# Patient Record
Sex: Male | Born: 1965 | Race: Asian | Hispanic: No | Marital: Single
Health system: Southern US, Community
[De-identification: ages and names within clinical notes are randomized; demographics above are authoritative.]

---

## 2005-09-29 ENCOUNTER — Ambulatory Visit (HOSPITAL_COMMUNITY): Admission: RE | Admit: 2005-09-29 | Discharge: 2005-09-29 | Payer: Self-pay | Admitting: Internal Medicine

## 2009-11-11 ENCOUNTER — Ambulatory Visit: Payer: Self-pay | Admitting: Gastroenterology

## 2009-11-26 ENCOUNTER — Ambulatory Visit (HOSPITAL_COMMUNITY): Admission: RE | Admit: 2009-11-26 | Discharge: 2009-11-26 | Payer: Self-pay | Admitting: Gastroenterology

## 2009-12-16 ENCOUNTER — Ambulatory Visit: Payer: Self-pay | Admitting: Gastroenterology

## 2010-03-24 ENCOUNTER — Ambulatory Visit: Payer: Self-pay | Admitting: Gastroenterology

## 2010-07-07 ENCOUNTER — Ambulatory Visit: Payer: Self-pay | Admitting: Gastroenterology

## 2010-07-28 ENCOUNTER — Ambulatory Visit (HOSPITAL_COMMUNITY): Admission: RE | Admit: 2010-07-28 | Discharge: 2010-07-28 | Payer: Self-pay | Admitting: Gastroenterology

## 2010-10-06 ENCOUNTER — Ambulatory Visit: Payer: Self-pay | Admitting: Gastroenterology

## 2010-11-05 ENCOUNTER — Encounter: Payer: Self-pay | Admitting: Gastroenterology

## 2010-11-06 ENCOUNTER — Encounter: Payer: Self-pay | Admitting: Internal Medicine

## 2011-02-14 ENCOUNTER — Other Ambulatory Visit: Payer: Self-pay | Admitting: Gastroenterology

## 2011-02-14 DIAGNOSIS — B169 Acute hepatitis B without delta-agent and without hepatic coma: Secondary | ICD-10-CM

## 2011-02-17 ENCOUNTER — Ambulatory Visit (HOSPITAL_COMMUNITY)
Admission: RE | Admit: 2011-02-17 | Discharge: 2011-02-17 | Disposition: A | Discharge status: Home / Self Care | Payer: BC Managed Care – PPO | Source: Ambulatory Visit | Attending: Gastroenterology | Admitting: Gastroenterology

## 2011-02-17 DIAGNOSIS — B169 Acute hepatitis B without delta-agent and without hepatic coma: Secondary | ICD-10-CM

## 2011-02-17 DIAGNOSIS — B1910 Unspecified viral hepatitis B without hepatic coma: Secondary | ICD-10-CM | POA: Insufficient documentation

## 2011-03-23 ENCOUNTER — Other Ambulatory Visit: Payer: Self-pay | Admitting: Gastroenterology

## 2011-03-23 ENCOUNTER — Ambulatory Visit (INDEPENDENT_AMBULATORY_CARE_PROVIDER_SITE_OTHER): Payer: BC Managed Care – PPO | Admitting: Gastroenterology

## 2011-03-23 VITALS — BP 107/80 | HR 80 | Temp 97.1°F | Ht 69.0 in | Wt 209.0 lb

## 2011-03-23 DIAGNOSIS — B181 Chronic viral hepatitis B without delta-agent: Secondary | ICD-10-CM

## 2011-03-24 LAB — HEPATIC FUNCTION PANEL
Albumin: 4.6 g/dL (ref 3.5–5.2)
Alkaline Phosphatase: 65 U/L (ref 39–117)
Total Bilirubin: 0.3 mg/dL (ref 0.3–1.2)
Total Protein: 7.2 g/dL (ref 6.0–8.3)

## 2011-03-24 LAB — PHOSPHORUS: Phosphorus: 2.8 mg/dL (ref 2.3–4.6)

## 2011-03-24 LAB — HEPATITIS B SURFACE ANTIGEN: Hepatitis B Surface Ag: POSITIVE — AB

## 2011-03-24 LAB — HEPATITIS B SURFACE ANTIBODY,QUALITATIVE: Hep B S Ab: NEGATIVE

## 2011-03-27 LAB — HEPATITIS B DNA, ULTRAQUANTITATIVE, PCR

## 2011-04-06 ENCOUNTER — Encounter: Payer: Self-pay | Admitting: Gastroenterology

## 2011-04-06 NOTE — Progress Notes (Signed)
   NAME:  Howard Rubio, Howard Rubio    MR#:  [id number]      DATE:  03/23/2011  DOB:  08-09-1966    cc: Primary Care Physician:  Loyal Jacobson, MD, [facility] [address], [place], [place] [address], Phone [phone] Referring Physician:  Bing Plume, MD, [facility], [address], [address], [place], [place] [address], [place] [phone]    REASON FOR VISIT:  Followup of genotype B, E antibody positive HBV.   History:  The patient returns today unaccompanied. There are no symptoms referable to his history of hepatitis B. There are no symptoms to suggest decompensated liver disease or vasculitis.   PAST MEDICAL HISTORY:  No interval change.   CURRENT MEDICATIONS:  Tenofovir 300 mg p.o. daily. His start date was 12/16/2009, putting him at 66 weeks of treatment.   Allergies:  Denies.   Habits:  Smoking, still smoking 3-6 cigarettes per day. Alcohol, no interval consumption.   REVIEW OF SYSTEMS:  All 10 systems reviewed today with the patient and they are negative other than which was mentioned above. CES-D was not completed today.   PHYSICAL EXAMINATION:  Constitutional: Well appearing. Vital signs: Height 69 inches, weight 209 pounds, blood pressure 107/80, pulse is 80, temperature is 97.1 Fahrenheit. Ears, nose, mouth and throat: Unremarkable oropharynx. No  thyromegaly or neck masses. Chest: Resonant to percussion. Clear to auscultation.  Heart:  Sounds normal S1, S2 without murmurs or rubs. Extremities:  There is no peripheral edema.  Abdominal:  Normal bowel sounds. No masses or tenderness. I could not appreciate liver edge or spleen tip. Lymphatics: No cervical or  inguinal lymphadenopathy. Central nervous system: No asterixis or focal neurologic findings.  Dermatologic:  Anicteric. No palmar erythema.  Eyes:  Anicteric sclera. Pupils are equal and reactive to light.   LABORATORY STUDIES:  From 10/06/2010; ALT was 21. His hepatitis B surface antigen  was positive, hepatitis B E antibody was positive, HBV DNA was undetectable.  Ultrasound 02/17/2011, was unremarkable.   Assessment:  The patient is a 45 year old gentleman with history of genotype B, E  antibody positive HBV. He has not required a biopsy. His start date for therapy was 12/16/2009, so that he is on week 66 of treatment with  tenofovir. His ALT and his HBV DNA are appropriately suppressed. He achieved HBV DNA suppression by week 18 of therapy.  Today, I reviewed the results of lab testing and the previous ultrasound with the patient. I have explained he continues to make progress with on tenofovir considering his viral load is suppressed.   PLAN:  1. Standard labs today including HBV DNA and ALT. 2. Standard labs including phosphorus and creatinine. 3. Hepatitis A vaccination completed. 4. To return in 6 months' time with an ultrasound to be done around the same time.            Brooke Dare, MD   ADDENDUM: ALT 21  HBV DNA undetectable  [phone]  D:  Thu Jun 07 17:59:21 2012 ; T:  Thu Jun 07 20:01:22 2012  Job #:  [id number]

## 2011-09-14 ENCOUNTER — Other Ambulatory Visit: Payer: Self-pay | Admitting: Gastroenterology

## 2011-09-14 DIAGNOSIS — B181 Chronic viral hepatitis B without delta-agent: Secondary | ICD-10-CM

## 2011-09-25 ENCOUNTER — Ambulatory Visit (HOSPITAL_COMMUNITY)
Admission: RE | Admit: 2011-09-25 | Discharge: 2011-09-25 | Disposition: A | Discharge status: Home / Self Care | Payer: BC Managed Care – PPO | Source: Ambulatory Visit | Attending: Gastroenterology | Admitting: Gastroenterology

## 2011-09-25 DIAGNOSIS — B181 Chronic viral hepatitis B without delta-agent: Secondary | ICD-10-CM

## 2011-09-25 DIAGNOSIS — B1910 Unspecified viral hepatitis B without hepatic coma: Secondary | ICD-10-CM | POA: Insufficient documentation

## 2011-09-25 DIAGNOSIS — N200 Calculus of kidney: Secondary | ICD-10-CM | POA: Insufficient documentation

## 2011-09-28 ENCOUNTER — Ambulatory Visit (INDEPENDENT_AMBULATORY_CARE_PROVIDER_SITE_OTHER): Payer: BC Managed Care – PPO | Admitting: Gastroenterology

## 2011-09-28 DIAGNOSIS — B181 Chronic viral hepatitis B without delta-agent: Secondary | ICD-10-CM

## 2011-09-28 LAB — HEPATIC FUNCTION PANEL
ALT: 18 U/L (ref 0–53)
AST: 15 U/L (ref 0–37)
Albumin: 4.3 g/dL (ref 3.5–5.2)
Alkaline Phosphatase: 62 U/L (ref 39–117)
Total Bilirubin: 0.3 mg/dL (ref 0.3–1.2)

## 2011-09-28 LAB — CREATININE, SERUM: Creat: 0.92 mg/dL (ref 0.50–1.35)

## 2011-09-28 LAB — AFP TUMOR MARKER: AFP-Tumor Marker: 4.2 ng/mL (ref 0.0–8.0)

## 2011-09-28 LAB — PHOSPHORUS: Phosphorus: 2.4 mg/dL (ref 2.3–4.6)

## 2011-09-28 MED ORDER — VIREAD 300 MG PO TABS: 300.0000 mg | ORAL_TABLET | Freq: Every day | ORAL | Status: AC

## 2011-09-29 LAB — HEPATITIS B SURFACE ANTIGEN: Hepatitis B Surface Ag: POSITIVE — AB

## 2011-09-29 LAB — HEPATITIS B SURFACE ANTIBODY,QUALITATIVE: Hep B S Ab: NEGATIVE

## 2011-10-02 LAB — HEPATITIS B E ANTIBODY: Hepatitis Be Antibody: POSITIVE — AB

## 2011-10-02 LAB — HEPATITIS B E ANTIGEN: Hepatitis Be Antigen: NEGATIVE

## 2011-10-05 ENCOUNTER — Encounter: Payer: Self-pay | Admitting: Gastroenterology

## 2011-10-05 NOTE — Progress Notes (Signed)
Howard Rubio, Howard Rubio    MR#:  [id number]      DATE:  09/30/2011  DOB:  11/04/1965    cc: Primary Care Physician: Loyal Jacobson, MD, [facility] [address], [place], [place] [address], Phone [phone]   Referring Physician: Bing Plume, MD, [facility], [address], [place], [place] [address], [place] [phone]     REASON FOR VISIT:  Followup of genotype B, E antibody positive hepatitis B virus.    HISTORY:  The patient returns today unaccompanied. There are no symptoms referable to his history of hepatitis B. There are no symptoms to suggest decompensated liver disease or vasculitis.   PAST MEDICAL HISTORY:  No interval change.   CURRENT MEDICATIONS:  Tenofovir 300 mg p.o. daily. His start date was 12/16/2009, so he has been on this for 93 weeks.    Allergies:  Denies.   HABITS:   smoking:  Still smoking approximately 3-6 cigarettes per day.   Alcohol:  No interval consumption.   REVIEW OF SYSTEMS:  All 10 systems reviewed today with the patient and are negative other than that which is mentioned above.  His -D was not completed.   PHYSICAL EXAMINATION:  Constitutional: Well-appearing without stigmata of chronic liver disease. Vital signs:  Height 69 inches, weight 205 pounds, blood pressure 111/85, pulse of 88, temperature 98.1 Fahrenheit. Ears, nose,  mouth and throat:  Unremarkable oropharynx.  No thyromegaly or neck masses.  Chest:  Resonant to percussion.  Clear to auscultation.  Cardiovascular:  Heart sounds normal S1, S2 without murmurs or rubs.   There is no peripheral edema.  Abdominal:  Normal bowel sounds.  No masses or tenderness.  I could not appreciate a liver edge or spleen tip.  I could not appreciate any hernias.  Lymphatics:  No cervical or  inguinal lymphadenopathy.  Central Nervous System:  No asterixis or focal neurologic findings.  Dermatologic:  Anicteric without palmar erythema or spider angiomata.   Eyes:  Anicteric sclerae.  Pupils are equal and reactive to light.   Laboratory data:  Previous labs from 03/23/2011 reveal ALT was 21, AST 15.  Hepatitis B DNA was not detectable. His E antigen and E antibody were not done.  His surface antigen was positive, and his surface antibody was negative.   IMAGING STUDIES:  Last imaging of his liver was by ultrasound on 09/14/2011, which showed an unremarkable liver. This was discussed with with the patient today.   assessment:  The patient is a 45 year old gentleman with a history of genotype B, E antibody positive hepatitis B virus. He has not been biopsied.  His start date for therapy was 12/16/2009, putting him at approximately  week 93 of treatment with tenofovir. His ALT and his hepatitis B virus DNA are appropriately suppressed.  His hepatitis B virus DNA suppression was achieved by week 18 of therapy.  In terms of his hepatitis management, he has received hepatitis A vaccination. He is up-to-date with his hepatocellular carcinoma screening by ultrasound on 09/14/2011.   Today, I reviewed the results of previous testing with the patient including ultrasound and lab tests. We discussed followup.   PLAN:  1. Standard labs today including hepatitis B virus DNA and ALT. 2. Check creatinine and phosphorus. 3. Hepatitis A vaccination completed. 4. Return in 6 months' time with ultrasound to be done around the same time. This was ordered in EPIC.            Viviann Spare  Jacqualine Mau, MD   ADDENDUM ALT 18   HBV DNA detected but < 20 IU/mL    e A b positive  [phone] .[phone]  D:  Thu Dec 13 19:46:36 2012 ; T:  Sat Dec 15 11:47:16 2012  Job #:  [id number]

## 2012-03-20 ENCOUNTER — Ambulatory Visit (HOSPITAL_COMMUNITY)
Admission: RE | Admit: 2012-03-20 | Discharge: 2012-03-20 | Disposition: A | Discharge status: Home / Self Care | Payer: BC Managed Care – PPO | Source: Ambulatory Visit | Attending: Gastroenterology | Admitting: Gastroenterology

## 2012-03-20 DIAGNOSIS — N200 Calculus of kidney: Secondary | ICD-10-CM | POA: Insufficient documentation

## 2012-03-20 DIAGNOSIS — B181 Chronic viral hepatitis B without delta-agent: Secondary | ICD-10-CM

## 2012-03-28 ENCOUNTER — Encounter: Payer: Self-pay | Admitting: Gastroenterology

## 2012-04-11 ENCOUNTER — Ambulatory Visit (INDEPENDENT_AMBULATORY_CARE_PROVIDER_SITE_OTHER): Payer: BC Managed Care – PPO | Admitting: Gastroenterology

## 2012-04-11 DIAGNOSIS — B181 Chronic viral hepatitis B without delta-agent: Secondary | ICD-10-CM

## 2012-04-11 LAB — HEPATIC FUNCTION PANEL
AST: 16 U/L (ref 0–37)
Bilirubin, Direct: 0.1 mg/dL (ref 0.0–0.3)
Indirect Bilirubin: 0.4 mg/dL (ref 0.0–0.9)
Total Bilirubin: 0.5 mg/dL (ref 0.3–1.2)

## 2012-04-11 LAB — PHOSPHORUS: Phosphorus: 3 mg/dL (ref 2.3–4.6)

## 2012-04-11 LAB — AFP TUMOR MARKER: AFP-Tumor Marker: 2.7 ng/mL (ref 0.0–8.0)

## 2012-04-12 LAB — HEPATITIS B SURFACE ANTIBODY,QUALITATIVE: Hep B S Ab: NEGATIVE

## 2012-04-15 LAB — HEPATITIS B DNA, ULTRAQUANTITATIVE, PCR

## 2012-05-02 NOTE — Progress Notes (Signed)
   [name], Howard Rubio    MR#:  [id number]      DATE:  04/11/2012  DOB:  06-30-1966    cc: Primary Care Physician: Howard Jacobson, MD, [facility], [address], [place], [place] [address], Phone [phone]  Referring Physician:  Bing Plume, MD, [facility], [address], [place], [place] [address], [place] [phone]    REASON FOR VISIT:  Follow up of genotype B, E antibody positive hepatitis B.   HISTORY:  The patient returns unaccompanied. He has no symptoms referable to his history of hepatitis B. There are no symptoms to suggest vasculitis or decompensated liver disease.   PAST MEDICAL HISTORY:  No interval change.   CURRENT MEDICATIONS:  Tenofovir 300 mg daily. He started on 12/16/2009, so that he is now 121 weeks into treatment.   ALLERGIES:  Denies.   HABITS:  Smoking, when asked, he says he is trying to quit. Alcohol, denies interval consumption.   REVIEW OF SYSTEMS:  All 10 systems reviewed today with the patient. He reports in the last 2 months he reports some intermittent central chest discomfort without radiation, which is not related to any particular activity  and can occur spontaneously at rest. He also complains of some tingling in the tips of D 4 and 5 of his left hand in the last month or so.   PHYSICAL EXAMINATION:  Constitutional: Well appearing without stigmata of chronic liver disease. Vital Signs: Height 69 inches, weight 206 pounds up a pound from previously, blood pressure 92/77, pulse 71, temperature 98.5 Fahrenheit.    LABORATORIES:  Previous labs were reviewed with the patient. I reviewed the ultrasound of 03/20/2012, with the patient, as well.   ASSESSMENT:  The patient is a 46 year old gentleman with history of genotype B, E antibody positive hepatitis B virus. He has not been biopsied. His start date for therapy was 12/16/2009, putting him at approximately week 121 of treatment with tenofovir.  Previously his HBV DNA and his ALT have been appropriately suppressed with HBV DNA suppression since at week 18 of therapy.   In terms of his hepatitis management, he received hepatitis A vaccination in the past. His hepatocellular cancer screening is accomplished by the ultrasound of 03/20/2012, and needs repeated within 6 months.   Today, I reviewed the results of testing with the patient. I have explained to him that his complaints of tingling and chest discomfort should be pursued with Dr. Leonette Rubio, as it is beyond the scope of my practice.   PLAN:  1. Standard labs including hepatitis B serologies.  2. Check creatinine and phosphorus.  3. Hepatitis A vaccination complete.  4. Return in approximately 6 months' time with an ultrasound at the same time, which was already ordered.               Howard Dare, MD   [phone]  D:  Thu Jun 27 17:01:23 2013 ; T:  Thu Jun 27 20:08:01 2013  Job #:  [id number]

## 2012-12-10 ENCOUNTER — Other Ambulatory Visit: Payer: Self-pay | Admitting: Internal Medicine

## 2012-12-10 DIAGNOSIS — Z1159 Encounter for screening for other viral diseases: Secondary | ICD-10-CM

## 2012-12-16 ENCOUNTER — Ambulatory Visit
Admission: RE | Admit: 2012-12-16 | Discharge: 2012-12-16 | Disposition: A | Discharge status: Home / Self Care | Payer: BC Managed Care – PPO | Source: Ambulatory Visit | Attending: Internal Medicine | Admitting: Internal Medicine

## 2012-12-16 DIAGNOSIS — Z1159 Encounter for screening for other viral diseases: Secondary | ICD-10-CM

## 2013-09-22 IMAGING — US US ABDOMEN COMPLETE
1 series · 14 of 25 positions shown · non-contrast
Comparison: Ultrasound dated 02/17/2011

CLINICAL DATA: Hepatitis B.

COMPLETE ABDOMINAL ULTRASOUND

[Series 1: us abdomen complete · 0.28mm/px · 14 of 83 slices shown]
[im 1/83]
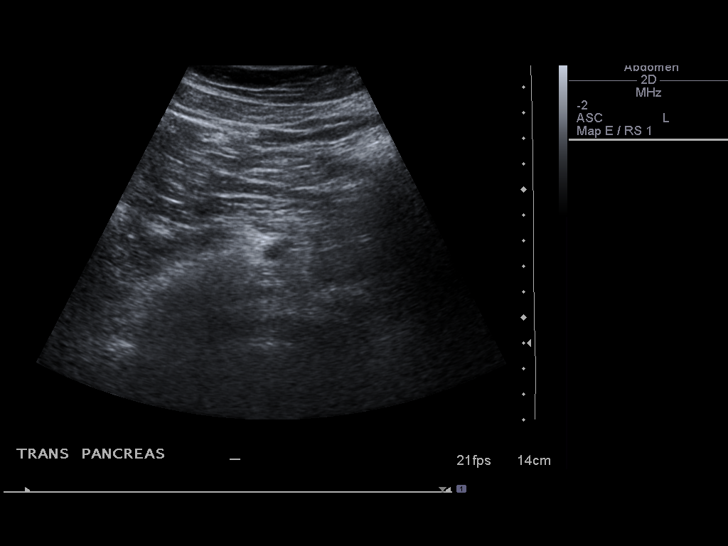
[im 7/83]
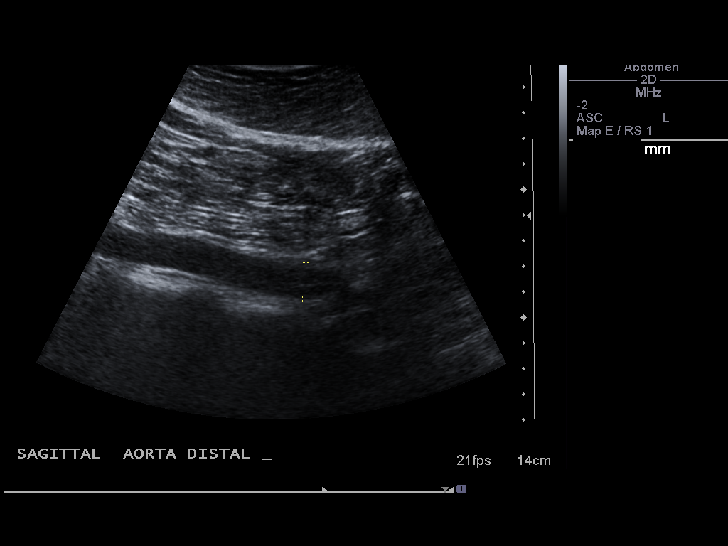
[im 14/83]
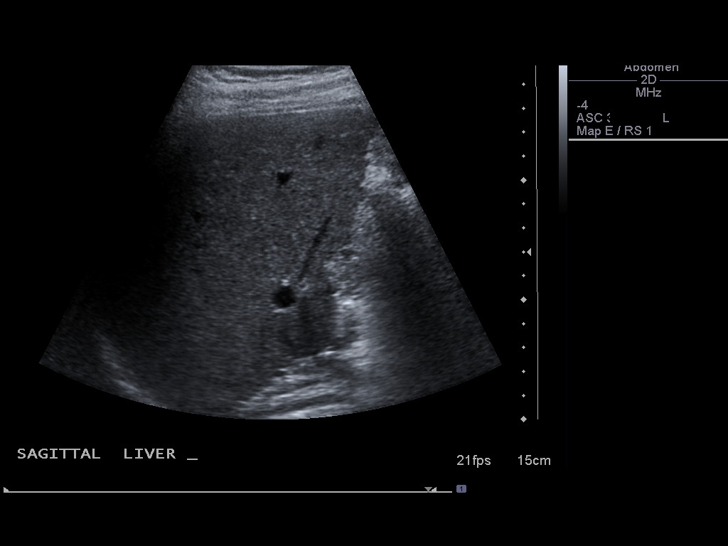
[im 21/83]
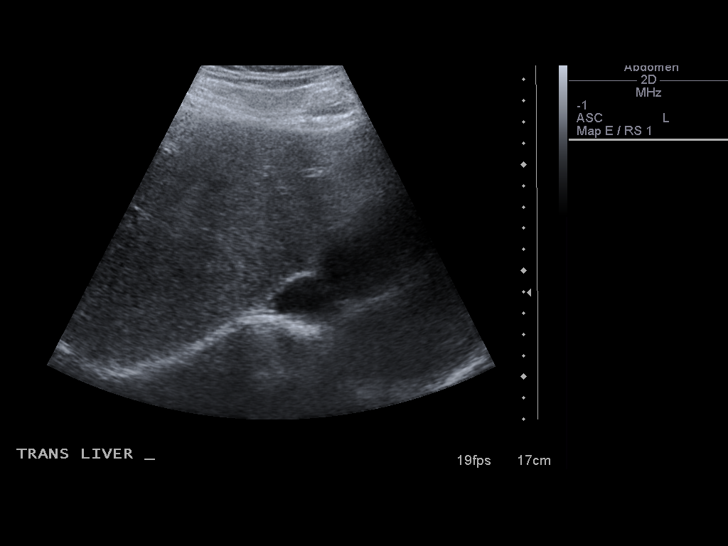
[im 28/83]
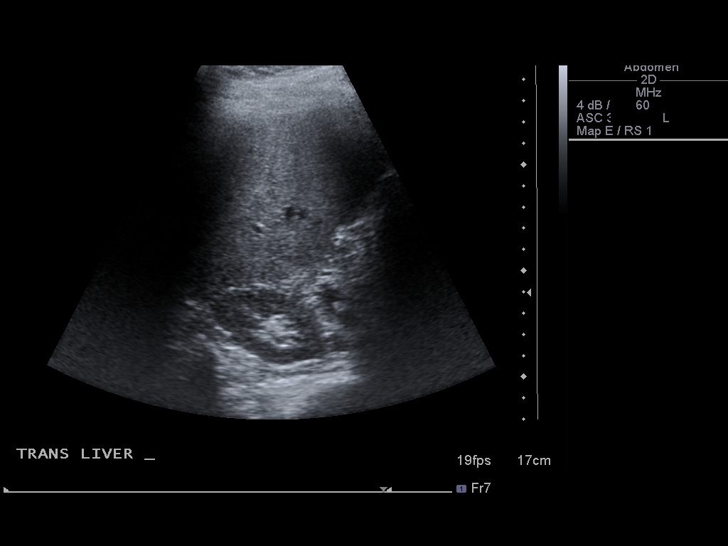
[im 31/83]
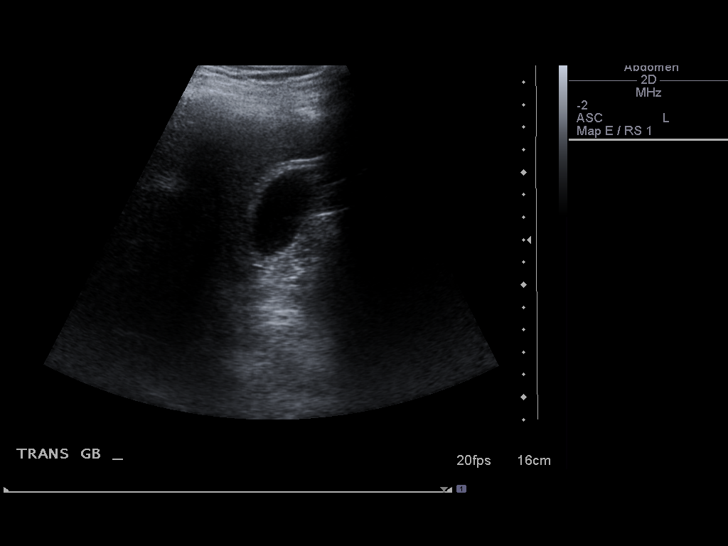
[im 38/83]
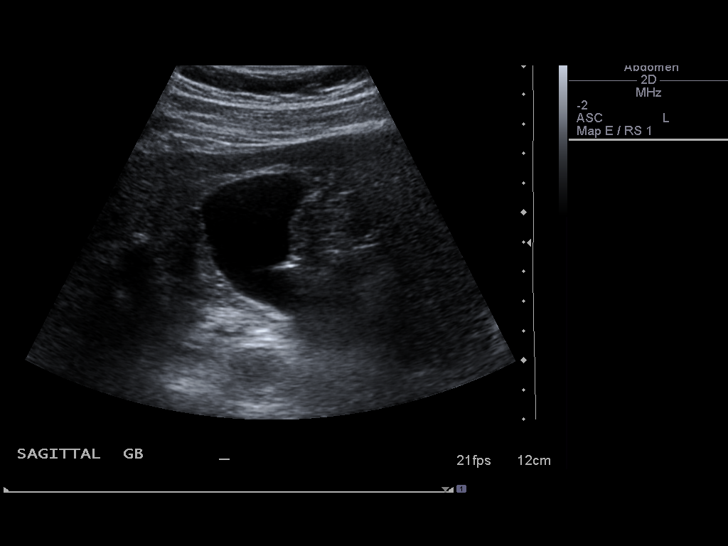
[im 45/83]
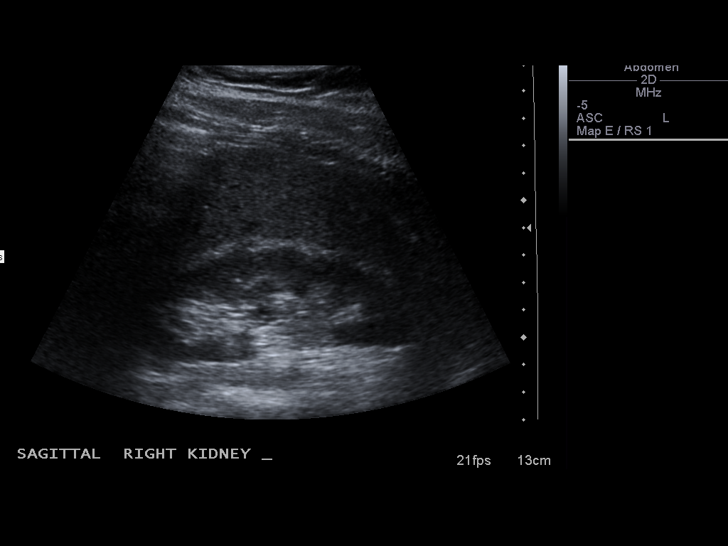
[im 52/83]
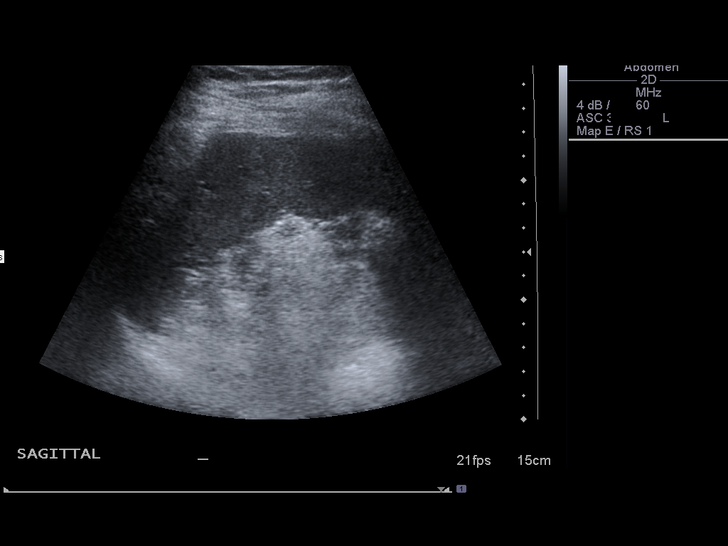
[im 55/83]
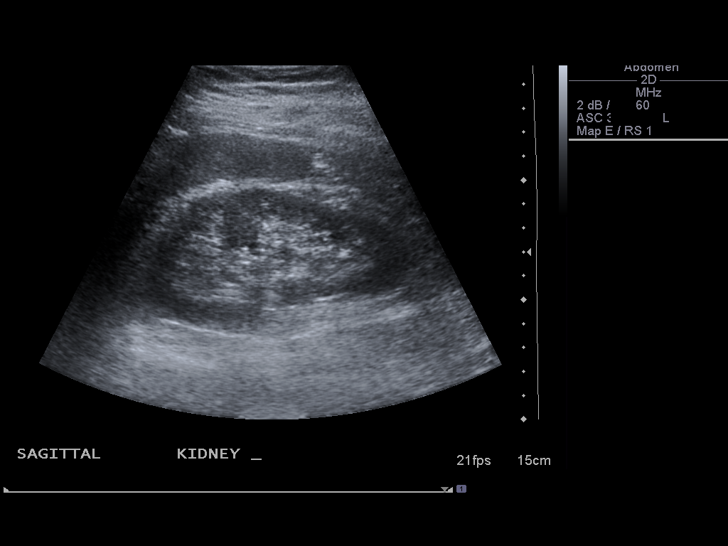
[im 62/83]
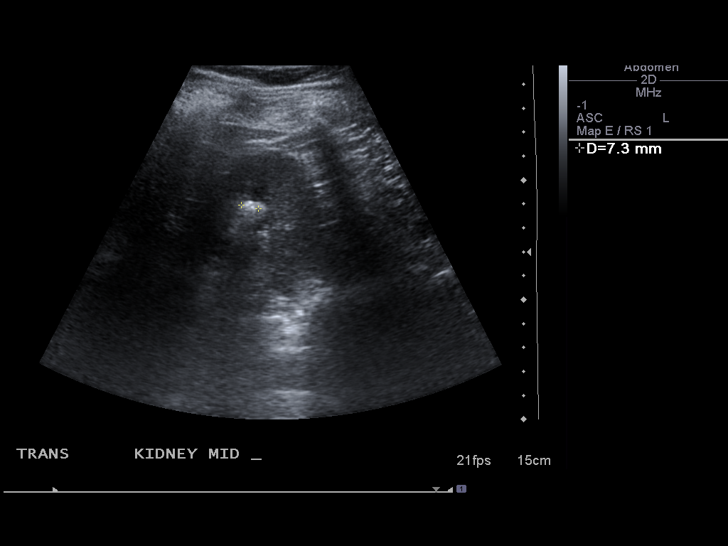
[im 69/83]
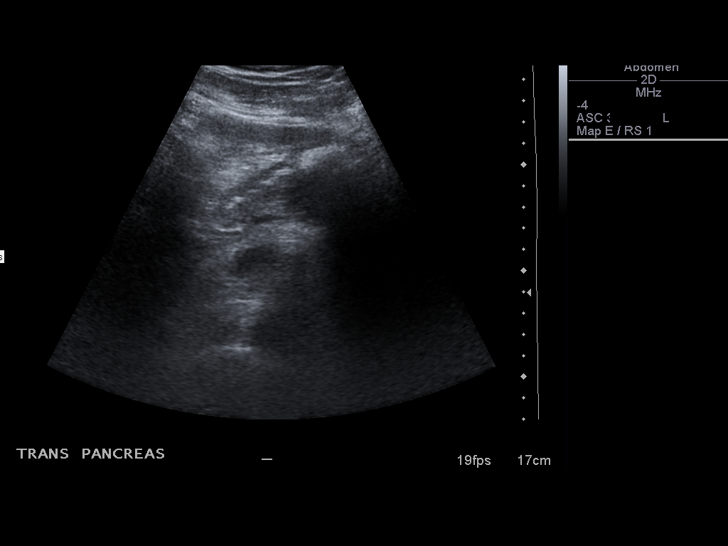
[im 76/83]
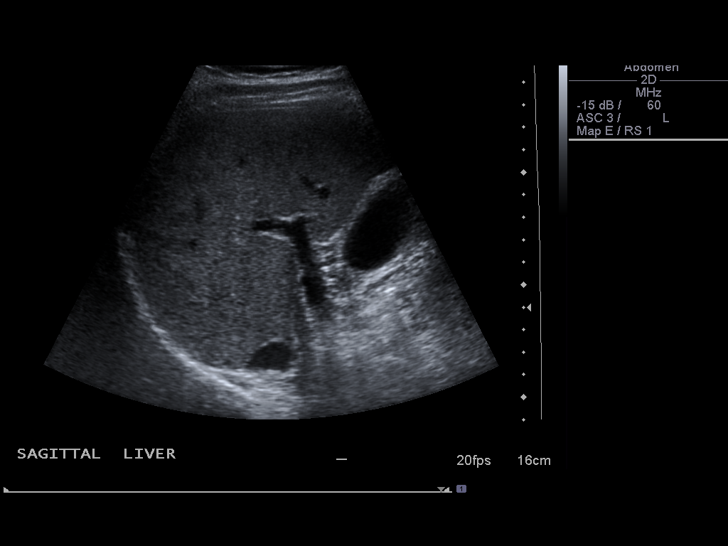
[im 83/83]
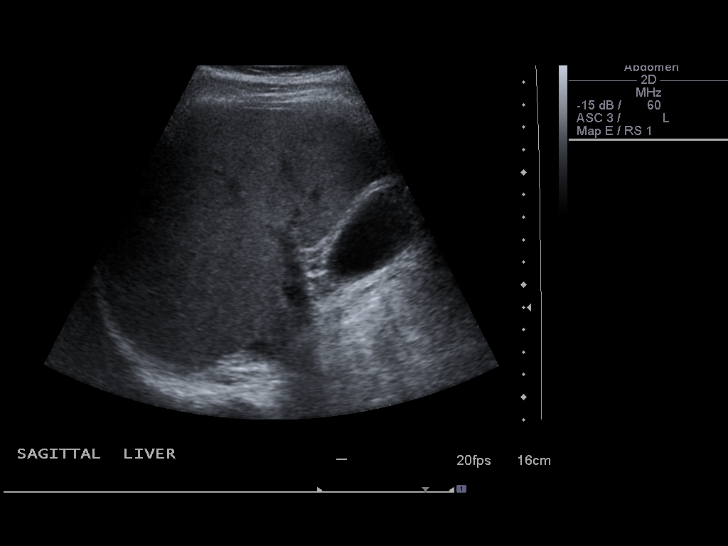

[14 of 25 positions shown; findings below may reference images not displayed]

FINDINGS: Gallbladder:  No gallstones, gallbladder wall thickening, or
pericholecystic fluid. Negative sonographic Murphy's sign.

Common bile duct:  Normal.  3.2 mm maximum diameter.

Liver:  Normal.  No focal lesions.  Normal liver parenchyma
echogenicity.

IVC:  Appears normal.

Pancreas:  The pancreas is partially obscured by bowel gas.  No
focal lesions.

Spleen:  Normal.  8.3 cm in length.

Right Kidney:  Normal.  11.3 cm in length.

Left Kidney:  7 mm nonobstructing stone in the midportion of the
left kidney.  Otherwise normal.  11.4 cm in length.

Abdominal aorta:  Normal.  1.8 cm maximal diameter.
IMPRESSION: Small stone in the left kidney, essentially unchanged.  Normal
appearing liver.  No acute abnormalities.

## 2013-11-06 ENCOUNTER — Other Ambulatory Visit: Payer: Self-pay | Admitting: Internal Medicine

## 2013-11-06 DIAGNOSIS — C220 Liver cell carcinoma: Secondary | ICD-10-CM

## 2013-11-11 ENCOUNTER — Ambulatory Visit
Admission: RE | Admit: 2013-11-11 | Discharge: 2013-11-11 | Disposition: A | Discharge status: Home / Self Care | Payer: BC Managed Care – PPO | Source: Ambulatory Visit | Attending: Internal Medicine | Admitting: Internal Medicine

## 2013-11-11 DIAGNOSIS — C220 Liver cell carcinoma: Secondary | ICD-10-CM

## 2014-10-20 ENCOUNTER — Other Ambulatory Visit: Payer: Self-pay | Admitting: Nurse Practitioner

## 2014-10-20 DIAGNOSIS — C220 Liver cell carcinoma: Secondary | ICD-10-CM

## 2014-11-12 ENCOUNTER — Ambulatory Visit
Admission: RE | Admit: 2014-11-12 | Discharge: 2014-11-12 | Disposition: A | Discharge status: Home / Self Care | Payer: BLUE CROSS/BLUE SHIELD | Source: Ambulatory Visit | Attending: Nurse Practitioner | Admitting: Nurse Practitioner

## 2014-11-12 DIAGNOSIS — C220 Liver cell carcinoma: Secondary | ICD-10-CM

## 2014-11-16 ENCOUNTER — Other Ambulatory Visit: Payer: Self-pay | Admitting: Nurse Practitioner

## 2014-11-16 DIAGNOSIS — K7689 Other specified diseases of liver: Secondary | ICD-10-CM

## 2014-12-12 ENCOUNTER — Inpatient Hospital Stay: Admission: RE | Admit: 2014-12-12 | Payer: BLUE CROSS/BLUE SHIELD | Source: Ambulatory Visit

## 2014-12-14 IMAGING — US US ABDOMEN LIMITED
1 series · 14 of 25 positions shown · non-contrast
Comparison: Ultrasound of the abdomen of 03/20/2012

CLINICAL DATA: Hepatitis C, follow-up

LIMITED ABDOMINAL ULTRASOUND - RIGHT UPPER QUADRANT

[Series 1: us abdomen limited · 0.21mm/px · 14 of 49 slices shown]
[im 1/49]
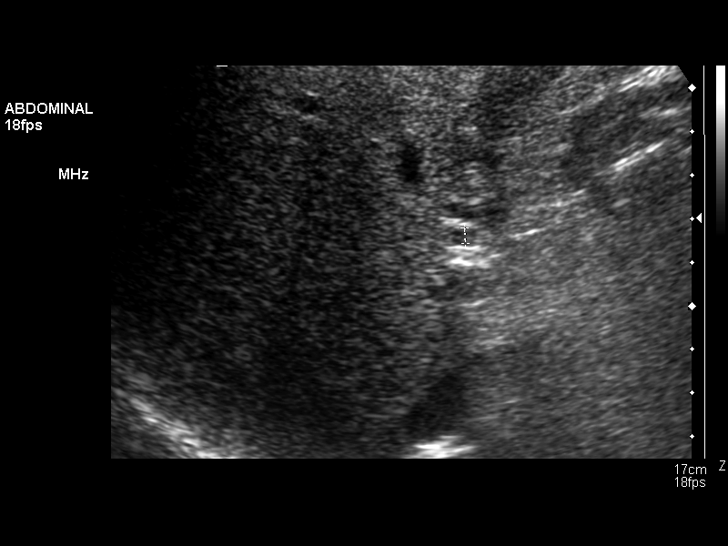
[im 5/49]
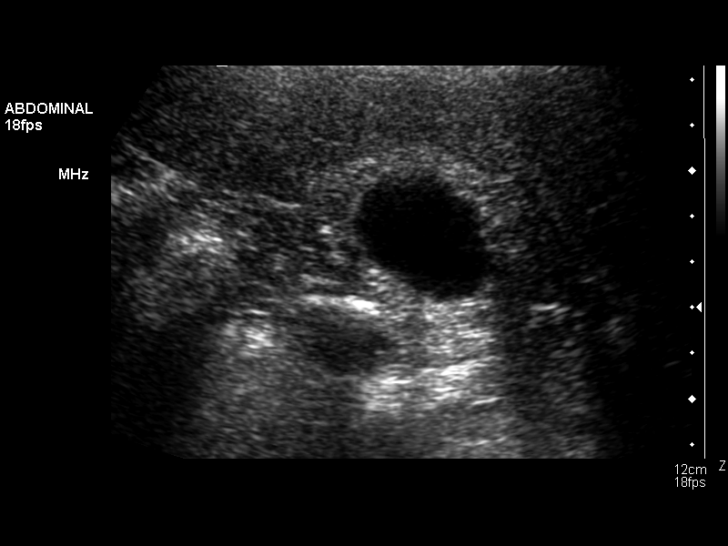
[im 9/49]
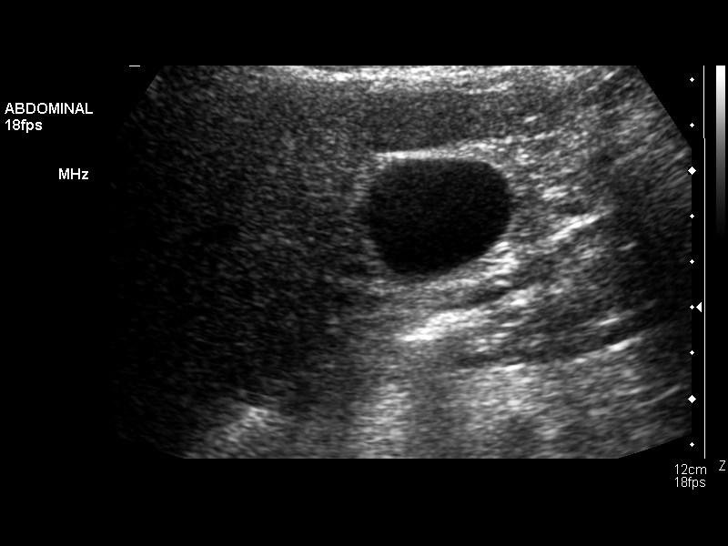
[im 13/49]
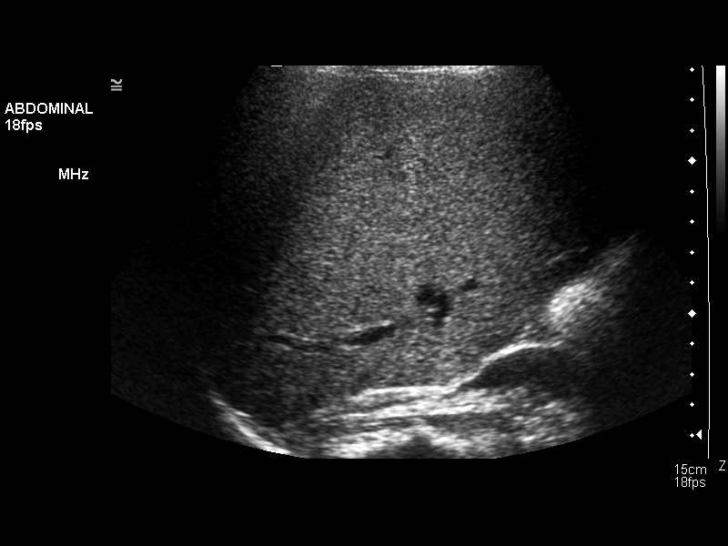
[im 17/49]
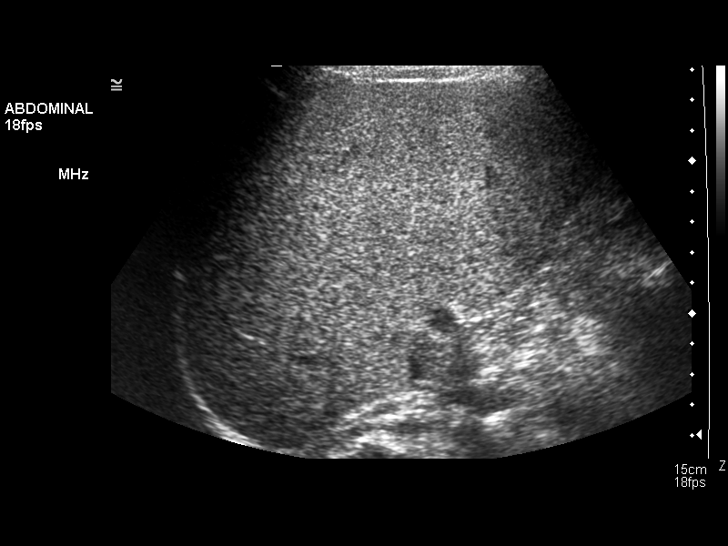
[im 19/49]
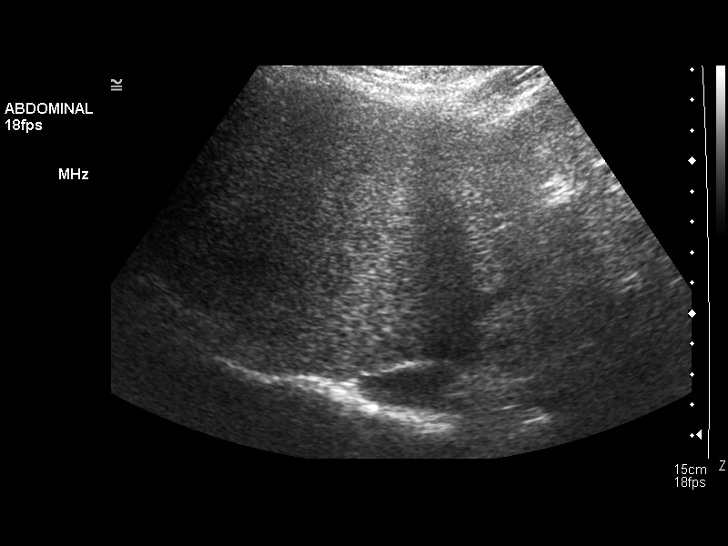
[im 23/49]
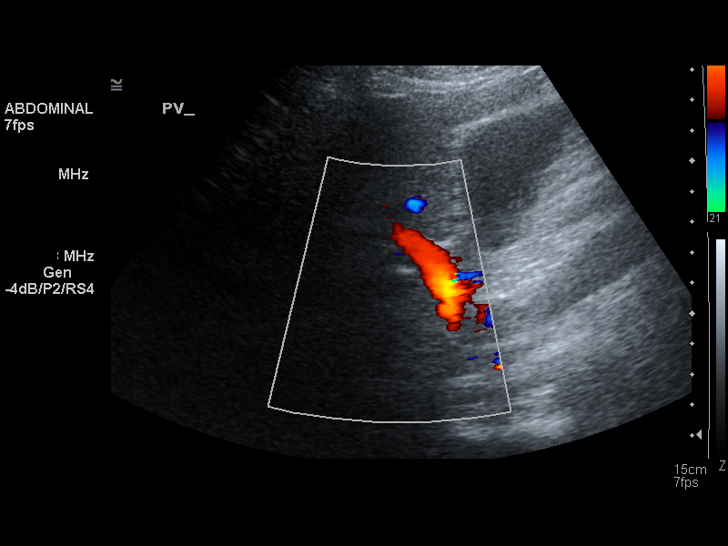
[im 27/49]
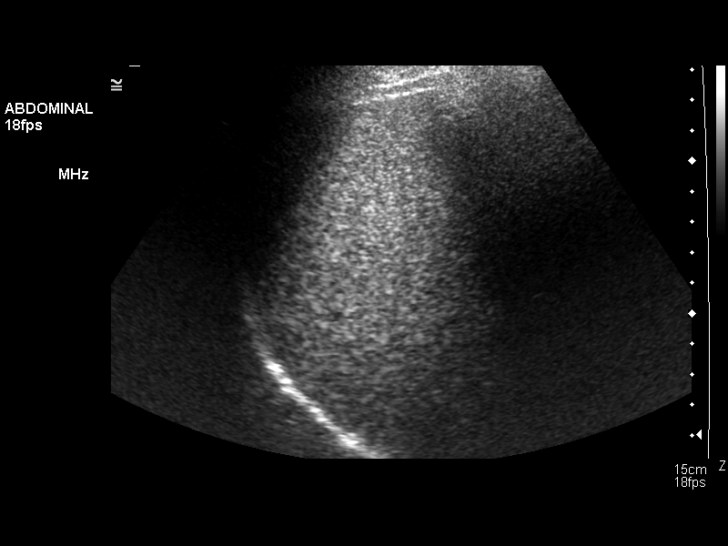
[im 31/49]
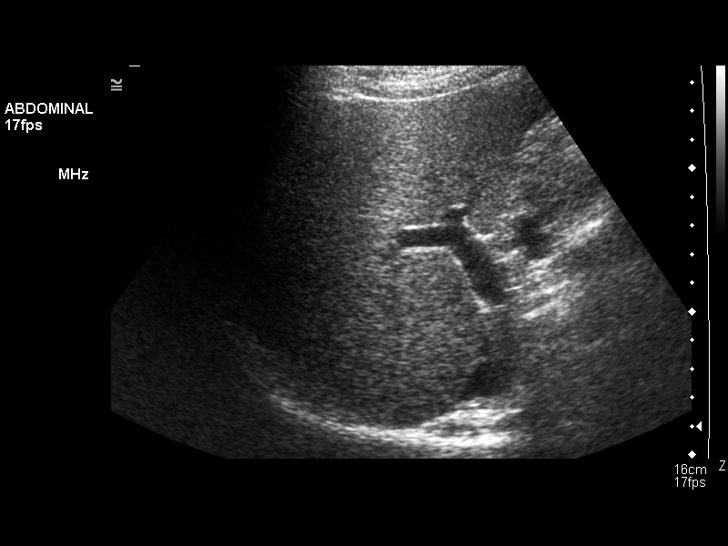
[im 33/49]
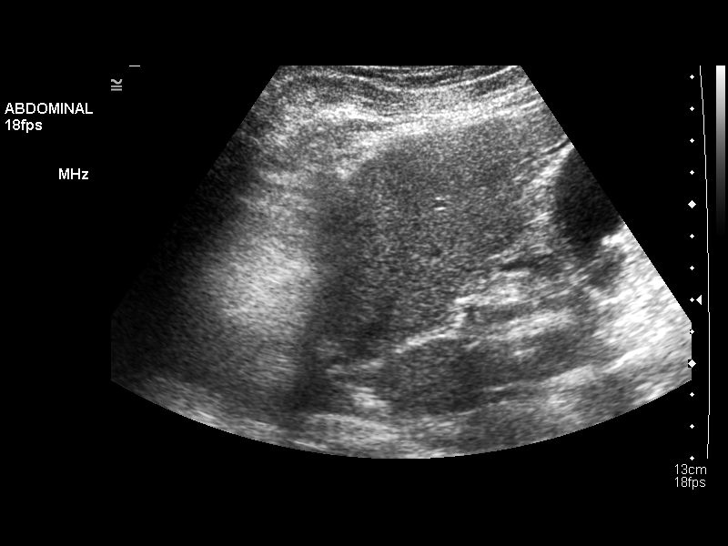
[im 37/49]
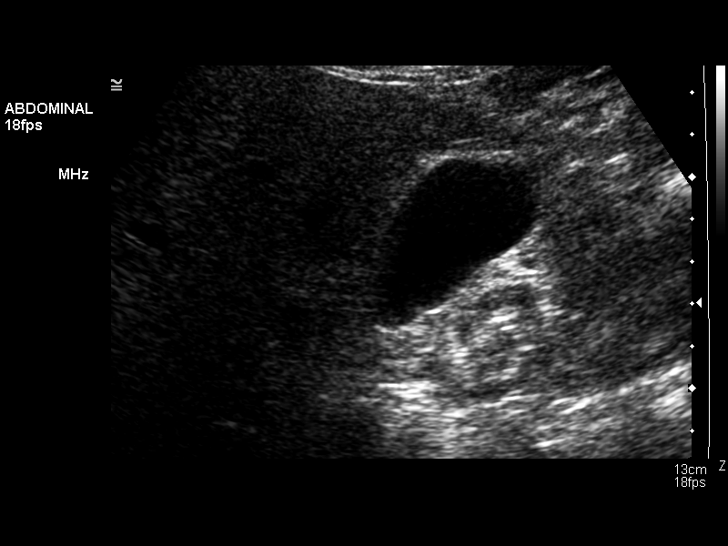
[im 41/49]
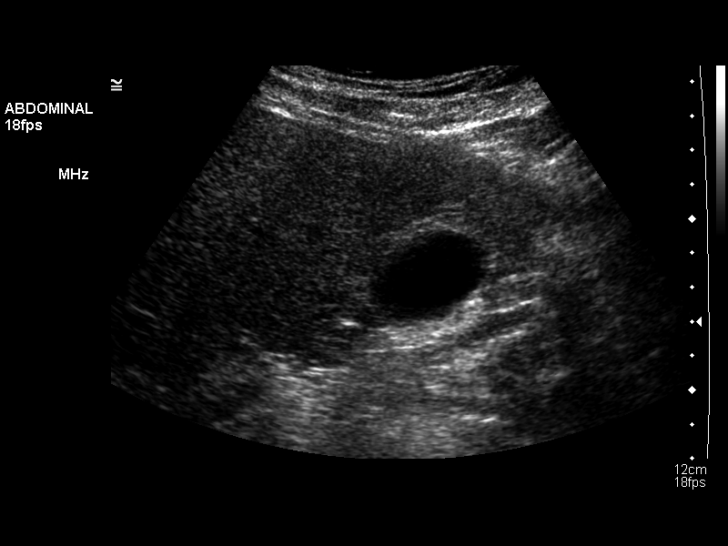
[im 45/49]
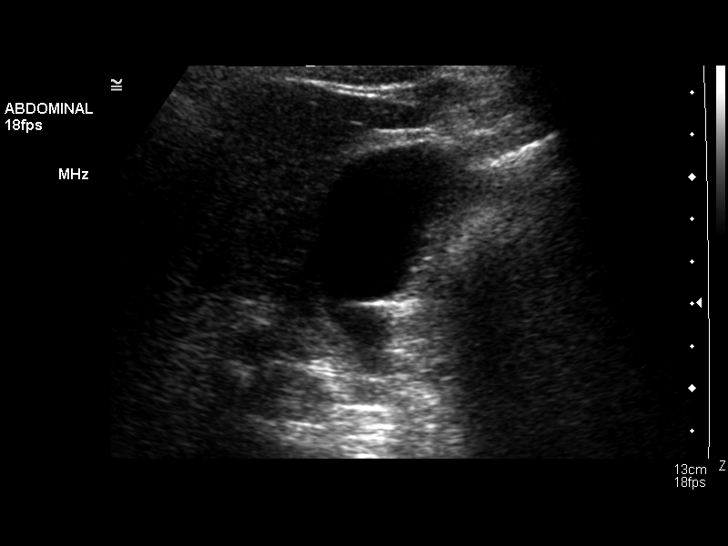
[im 49/49]
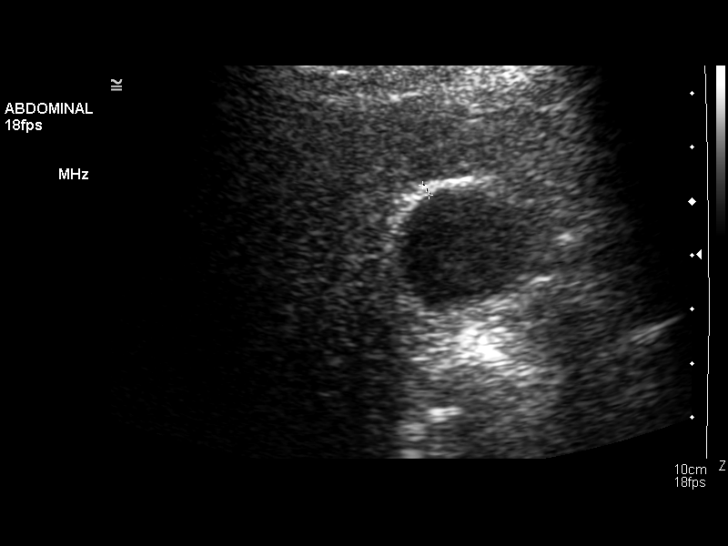

[14 of 25 positions shown; findings below may reference images not displayed]

FINDINGS: Gallbladder:  The gallbladder is visualized and no gallstones are
noted.  There is no pain over the gallbladder with compression.

Common bile duct:  The common bile duct is normal measuring 3.6 mm
in diameter.

Liver:  The liver is slightly inhomogeneous and echogenic
suggesting mild fatty infiltration.  No focal hepatic abnormality
is noted.
IMPRESSION: 1.  No gallstones.  No ductal dilatation.
2.  Question mild fatty infiltration of the liver.

## 2016-11-09 IMAGING — US US ABDOMEN LIMITED
1 series · 14 of 25 positions shown · non-contrast
Comparison: Ultrasound [DATE]/ 9420.

CLINICAL DATA: Hepatitis B. evaluate for hepatoma.

EXAM:
US ABDOMEN LIMITED - RIGHT UPPER QUADRANT

[Series 1: us abdomen limited · 0.32mm/px · 14 of 54 slices shown]
[im 1/54]
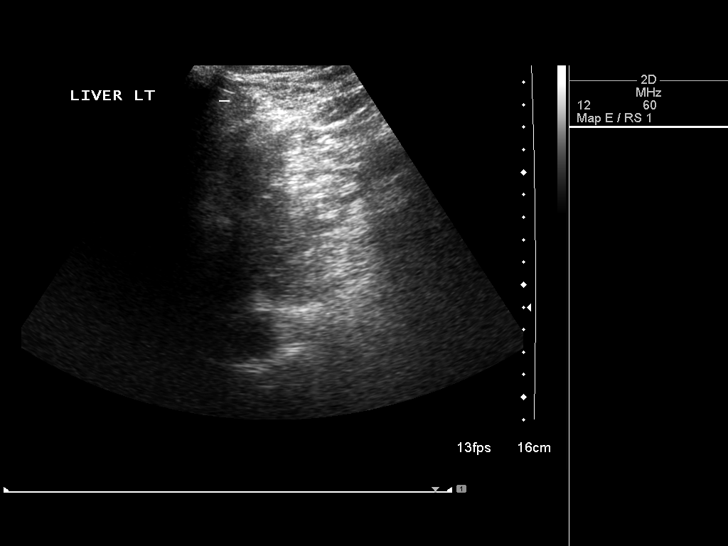
[im 5/54]
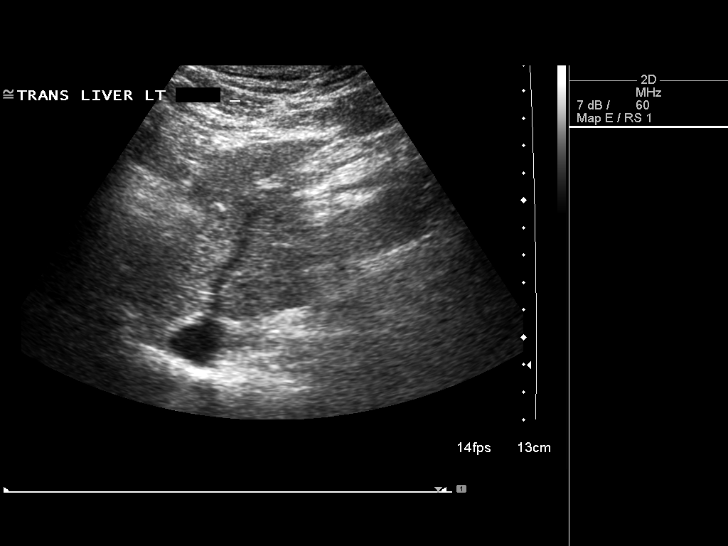
[im 9/54]
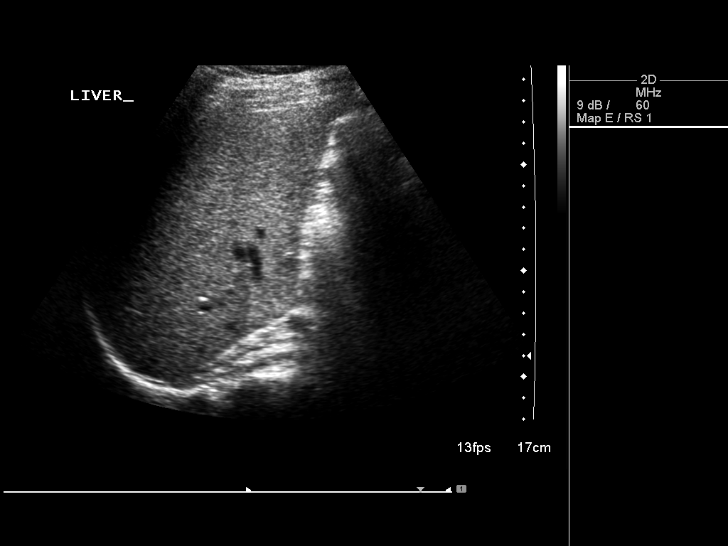
[im 14/54]
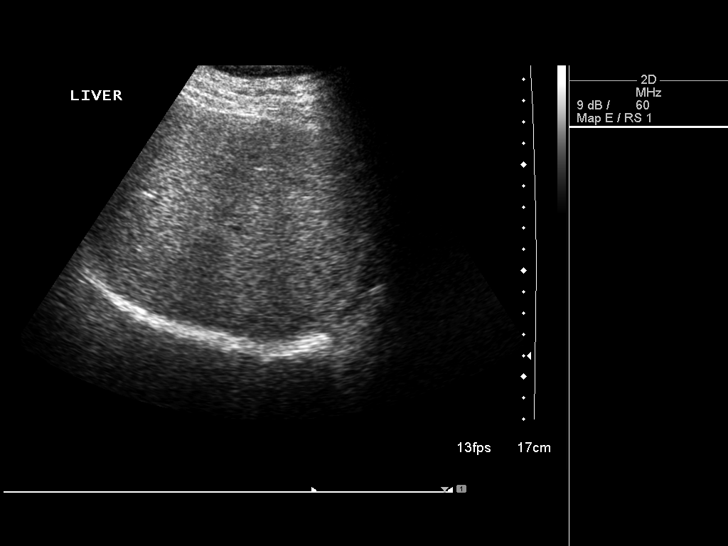
[im 18/54]
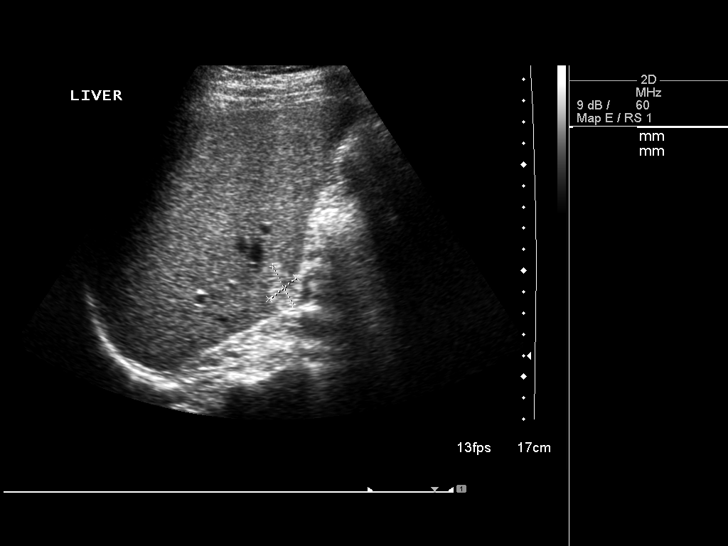
[im 20/54]
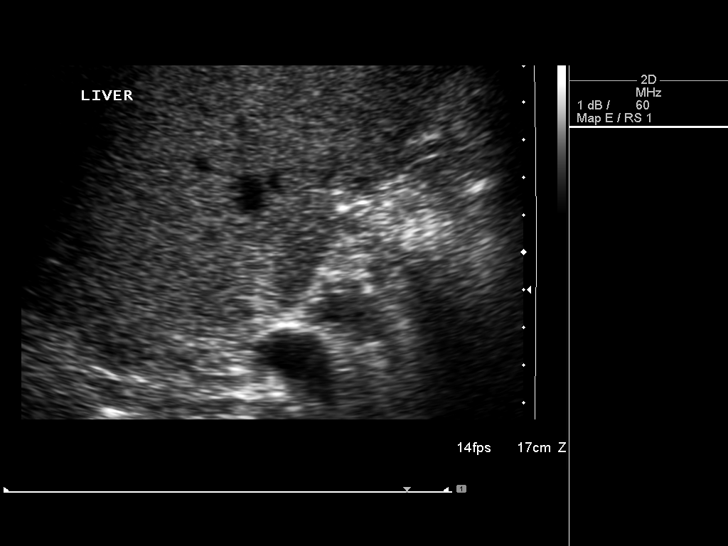
[im 25/54]
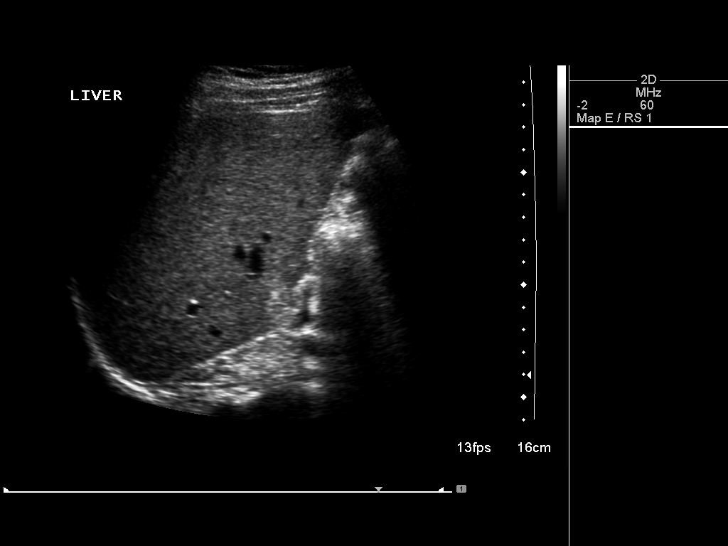
[im 29/54]
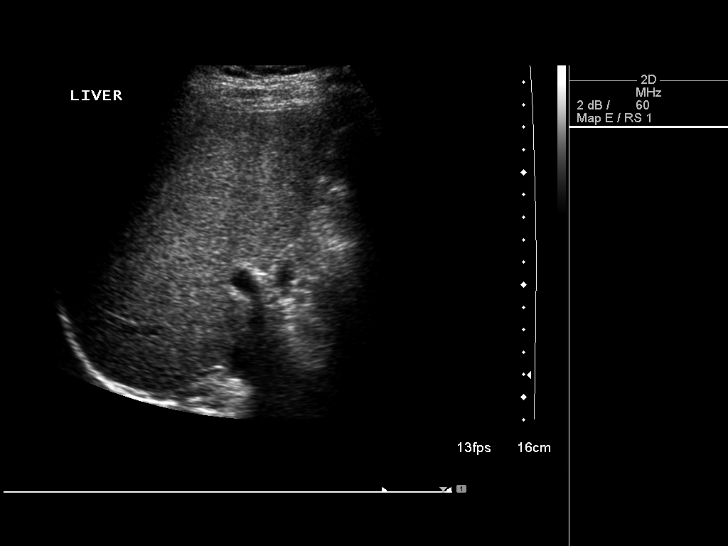
[im 34/54]
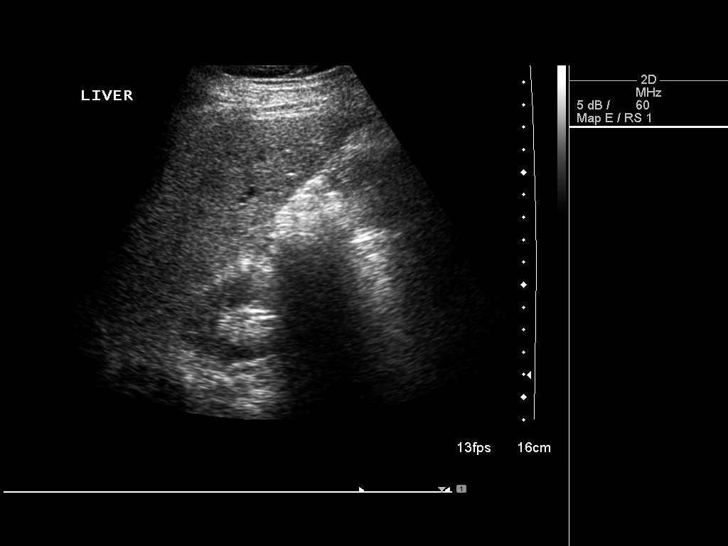
[im 36/54]
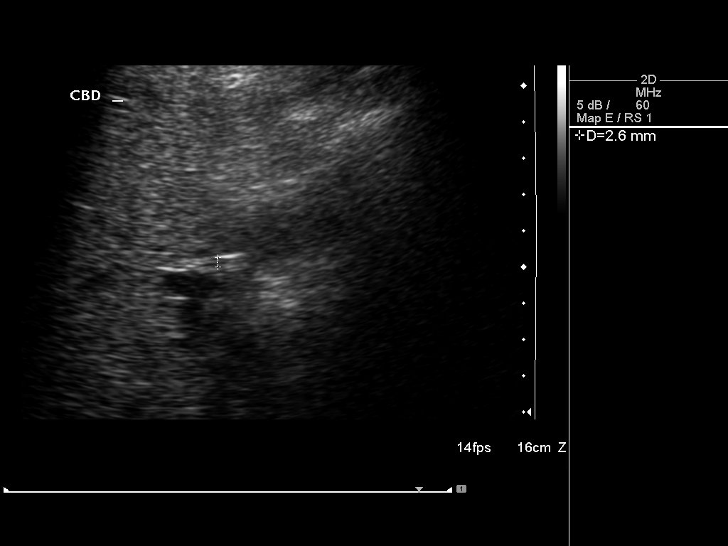
[im 40/54]
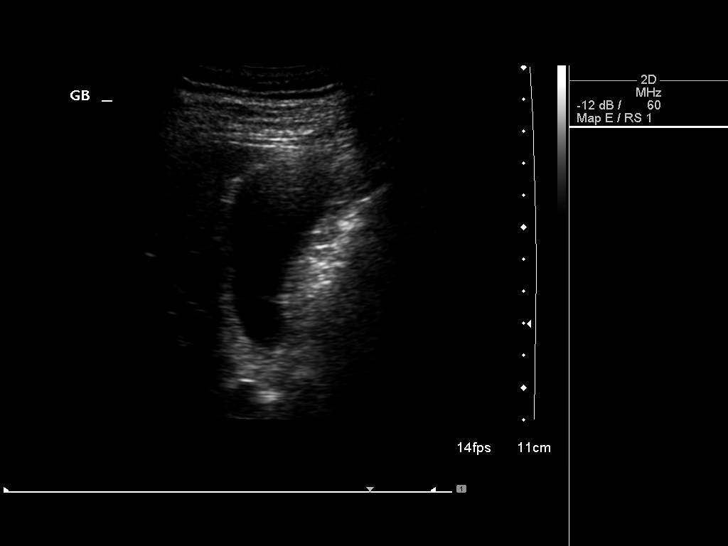
[im 45/54]
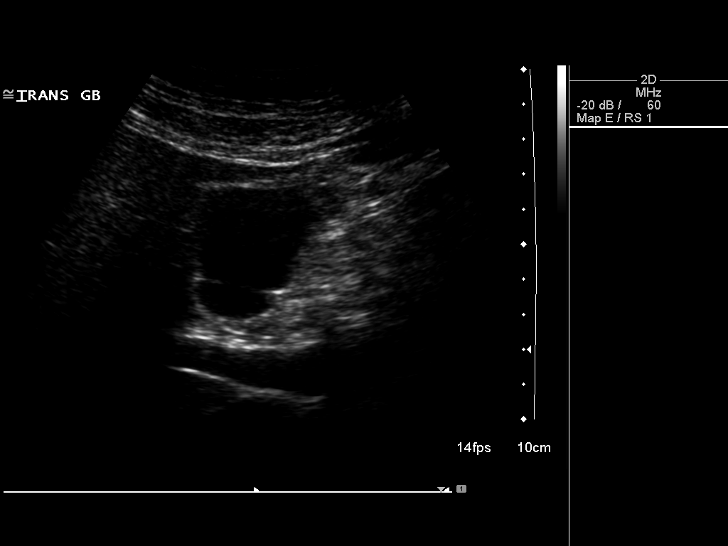
[im 49/54]
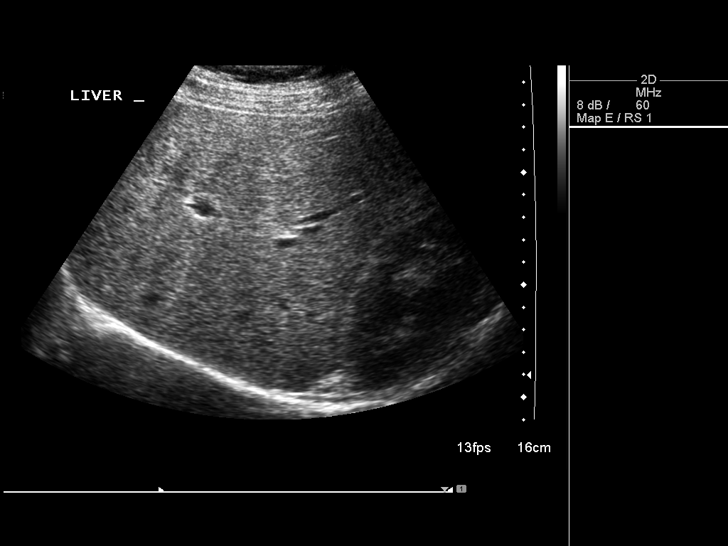
[im 54/54]
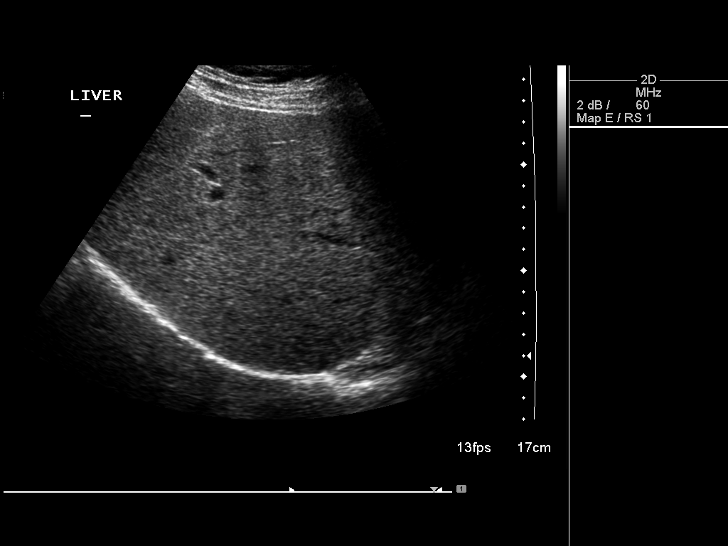

[14 of 25 positions shown; findings below may reference images not displayed]

FINDINGS: Gallbladder:

No gallstones or wall thickening visualized. No sonographic Murphy
sign noted.

Common bile duct:

Diameter: 2.6 mm

Liver:

2.0 x 1.6 x 1.7 cm hyperechoic lesion in the right lobe of the
liver. Statistically this is most likely a tiny hemangioma. This is
not definitely present on prior ultrasound. Given patient's history
of chronic hepatitis-B further evaluation with gadolinium-enhanced
MRI suggested.
IMPRESSION: 2.0 x 1.6 x 1.7 cm hyperechoic lesion right lobe liver.
Statistically this is most likely a tiny hemangioma. However this is
not definitely present on prior ultrasound and given patient's
history of chronic hepatitis-B, further evaluation with
gadolinium-enhanced MRI is suggested.
